# Patient Record
Sex: Male | Born: 1995 | Race: Black or African American | Hispanic: No | Marital: Single
Health system: Midwestern US, Community
[De-identification: ages and names within clinical notes are randomized; demographics above are authoritative.]

## PROBLEM LIST (undated history)

## (undated) DIAGNOSIS — T7840XA Allergy, unspecified, initial encounter: Secondary | ICD-10-CM

## (undated) HISTORY — DX: Allergy, unspecified, initial encounter: T78.40XA

---
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
## 2013-12-04 ENCOUNTER — Ambulatory Visit (INDEPENDENT_AMBULATORY_CARE_PROVIDER_SITE_OTHER): Payer: BC Managed Care – PPO | Admitting: Emergency Medicine

## 2013-12-04 VITALS — BP 126/72 | HR 80 | Temp 99.7°F | Resp 18 | Ht 75.0 in | Wt 186.0 lb

## 2013-12-04 DIAGNOSIS — J039 Acute tonsillitis, unspecified: Secondary | ICD-10-CM

## 2013-12-04 MED ORDER — AMOXICILLIN 500 MG PO CAPS: 500.0000 mg | ORAL_CAPSULE | Freq: Three times a day (TID) | ORAL | Status: DC

## 2013-12-04 NOTE — Patient Instructions (Signed)

## 2013-12-04 NOTE — Progress Notes (Signed)
[facility] [address], [place] [address] [phone]  Date:  12/04/2013   Name:  Victor BraverLaquan Jordan   DOB:  21-Dec-1995   MRN:  [id number]  PCP:  No PCP Per Patient    Chief Complaint: Sore Throat   History of Present Illness:  Victor Jordan is a 18 y.o. very pleasant male patient who presents with the following:  Has sore throat and fever.  Has difficulty swallowing.  No coryza or cough.  No wheezing or shortness of breath No nausea or vomiting.  No stool change.  No rash No improvement with over the counter medications or other home remedies.  Ill for past three days/ Denies other complaint or health concern today.   There are no active problems to display for this patient.   Past Medical History  Diagnosis Date  . Allergy     History reviewed. No pertinent past surgical history.  History  Substance Use Topics  . Smoking status: Never Smoker   . Smokeless tobacco: Never Used  . Alcohol Use: No    Family History  Problem Relation Age of Onset  . Diabetes Father     No Known Allergies  Medication list has been reviewed and updated.  Current Outpatient Prescriptions on File Prior to Visit  Medication Sig Dispense Refill  . desloratadine (CLARINEX) 5 MG tablet Take 5 mg by mouth daily.    . ondansetron (ZOFRAN ODT) 8 MG disintegrating tablet Take 1 tablet (8 mg total) by mouth every 8 (eight) hours as needed for nausea or vomiting. 10 tablet 0   No current facility-administered medications on file prior to visit.    Review of Systems:  As per HPI, otherwise negative.    Physical Examination: Filed Vitals:   12/04/13 1308  BP: 126/72  Pulse: 80  Temp: 99.7 F (37.6 C)  Resp: 18   Filed Vitals:   12/04/13 1308  Height: 6\' 3"  (1.905 m)  Weight: 186 lb (84.369 kg)   Body mass index is 23.25 kg/(m^2). Ideal Body Weight: Weight in (lb) to have BMI = 25: 199.6  GEN: WDWN, NAD, Non-toxic, A & O x 3 HEENT: Atraumatic,  Normocephalic. Neck supple. No masses, No LAD.  Exudative tonsillitis Ears and Nose: No external deformity. CV: RRR, No M/G/R. No JVD. No thrill. No extra heart sounds. PULM: CTA B, no wheezes, crackles, rhonchi. No retractions. No resp. distress. No accessory muscle use. ABD: S, NT, ND, +BS. No rebound. No HSM. EXTR: No c/c/e NEURO Normal gait.  PSYCH: Normally interactive. Conversant. Not depressed or anxious appearing.  Calm demeanor.    Assessment and Plan: Tonsillitis Amoxicillin  Signed,  Phillips OdorJeffery Lilyahna Sirmon, MD

## 2014-11-05 ENCOUNTER — Emergency Department (HOSPITAL_COMMUNITY): Payer: Self-pay

## 2014-11-05 ENCOUNTER — Emergency Department (HOSPITAL_COMMUNITY)
Admission: EM | Admit: 2014-11-05 | Discharge: 2014-11-06 | Disposition: A | Discharge status: Home / Self Care | Payer: BLUE CROSS/BLUE SHIELD | Attending: Emergency Medicine | Admitting: Emergency Medicine

## 2014-11-05 ENCOUNTER — Encounter (HOSPITAL_COMMUNITY): Payer: Self-pay | Admitting: *Deleted

## 2014-11-05 DIAGNOSIS — S0181XA Laceration without foreign body of other part of head, initial encounter: Secondary | ICD-10-CM | POA: Insufficient documentation

## 2014-11-05 DIAGNOSIS — Y9289 Other specified places as the place of occurrence of the external cause: Secondary | ICD-10-CM | POA: Insufficient documentation

## 2014-11-05 DIAGNOSIS — S0990XA Unspecified injury of head, initial encounter: Secondary | ICD-10-CM

## 2014-11-05 DIAGNOSIS — Z720 Tobacco use: Secondary | ICD-10-CM | POA: Insufficient documentation

## 2014-11-05 DIAGNOSIS — Y9383 Activity, rough housing and horseplay: Secondary | ICD-10-CM | POA: Insufficient documentation

## 2014-11-05 DIAGNOSIS — W01198A Fall on same level from slipping, tripping and stumbling with subsequent striking against other object, initial encounter: Secondary | ICD-10-CM | POA: Insufficient documentation

## 2014-11-05 DIAGNOSIS — Y998 Other external cause status: Secondary | ICD-10-CM | POA: Insufficient documentation

## 2014-11-05 MED ORDER — LIDOCAINE-EPINEPHRINE 2 %-1:100000 IJ SOLN
20.0000 mL | Freq: Once | INTRAMUSCULAR | Status: DC
Start: 2014-11-05 — End: 2014-11-06
  Filled 2014-11-05: qty 1

## 2014-11-05 NOTE — ED Notes (Signed)
Pt being sutured

## 2014-11-05 NOTE — ED Notes (Cosign Needed)
Dr. Estell HarpinZammit requested me to perform suture repair.    LACERATION REPAIR Performed by: Fayrene HelperRAN,Vivaan Helseth Authorized byFayrene Helper: Mavric Cortright Consent: Verbal consent obtained. Risks and benefits: risks, benefits and alternatives were discussed Consent given by: patient Patient identity confirmed: provided demographic data Prepped and Draped in normal sterile fashion Wound explored  Laceration Location: L forehead  Laceration Length: 3cm  Small debris seen in wound, which was cleansed and removed  Anesthesia: local infiltration  Local anesthetic: lidocaine 2% w epinephrine  Anesthetic total: 3 ml  Irrigation method: syringe Amount of cleaning: standard  Skin closure: prolene 5.0  Number of sutures: 4  Technique: surgical debridement with sterile scissor, approximation and undermine of tissue, simple interrupted  Patient tolerance: Patient tolerated the procedure well with no immediate complications.  LACERATION REPAIR Performed by: Fayrene HelperRAN,Josaiah Muhammed Authorized byFayrene Helper: Talulah Schirmer Consent: Verbal consent obtained. Risks and benefits: risks, benefits and alternatives were discussed Consent given by: patient Patient identity confirmed: provided demographic data Prepped and Draped in normal sterile fashion Wound explored  Laceration Location: L medial temple region, adjacent to lateral canthus  Laceration Length: 2 cm  No Foreign Bodies seen or palpated  Anesthesia: local infiltration  Local anesthetic: lidocaine 2% w epinephrine  Anesthetic total: 1 ml  Irrigation method: syringe Amount of cleaning: standard  Skin closure: prolene 6.0  Number of sutures: 2  Technique: simple interrupted  Patient tolerance: Patient tolerated the procedure well with no immediate complications.    Fayrene HelperBowie Susy Placzek, PA-C 11/06/14 0002

## 2014-11-05 NOTE — ED Notes (Signed)
Pt states that he was horseplaying and fell out of a moving truck bed; pt states that he struck the left side if face in the cement; pt denies LOC; pt denies neck or back pain; pt with several lacerations and abrasions to forehead , cheek and ear to left side of face; pt also with abrasions to left shoulder, left elbow and left hand; pt ambulatory to triage without difficulty

## 2014-11-05 NOTE — ED Notes (Signed)
Returned from CT.

## 2014-11-06 MED ORDER — CEPHALEXIN 500 MG PO CAPS: 500.0000 mg | ORAL_CAPSULE | Freq: Three times a day (TID) | ORAL | Status: DC

## 2014-11-06 MED ORDER — IBUPROFEN 800 MG PO TABS: 800.0000 mg | ORAL_TABLET | Freq: Three times a day (TID) | ORAL | Status: DC | PRN

## 2014-11-06 NOTE — Discharge Instructions (Signed)
Follow up with Dr. Pollyann Kennedyosen in 2-3 days,  Or see one of his partners.  Sutures out in one week

## 2014-11-09 ENCOUNTER — Encounter (HOSPITAL_COMMUNITY): Payer: Self-pay | Admitting: Emergency Medicine

## 2014-11-09 ENCOUNTER — Emergency Department (HOSPITAL_COMMUNITY)
Admission: EM | Admit: 2014-11-09 | Discharge: 2014-11-09 | Disposition: A | Discharge status: Home / Self Care | Payer: BLUE CROSS/BLUE SHIELD | Attending: Emergency Medicine | Admitting: Emergency Medicine

## 2014-11-09 DIAGNOSIS — F17210 Nicotine dependence, cigarettes, uncomplicated: Secondary | ICD-10-CM | POA: Diagnosis not present

## 2014-11-09 DIAGNOSIS — Z4802 Encounter for removal of sutures: Secondary | ICD-10-CM | POA: Diagnosis not present

## 2014-11-09 DIAGNOSIS — Z4801 Encounter for change or removal of surgical wound dressing: Secondary | ICD-10-CM | POA: Diagnosis present

## 2014-11-09 DIAGNOSIS — Z5189 Encounter for other specified aftercare: Secondary | ICD-10-CM

## 2014-11-09 NOTE — ED Provider Notes (Signed)
CSN: [id number]     Arrival date & time 11/09/14  1133 History  By signing my name below, I, Elon SpannerGarrett Cook, attest that this documentation has been prepared under the direction and in the presence of [facility] HospitalKaitlyn Juanmiguel Defelice, PA-C. Electronically Signed: Elon SpannerGarrett Cook ED Scribe. 11/09/2014. 11:53 AM.    Chief Complaint  Patient presents with  . Wound Check   The history is provided by the patient. No language interpreter was used.   HPI Comments: Garald BraverLaquan Provencher is a 19 y.o. male who presents to the Emergency Department for wound check/removal of sutures on the left forehead and temple placed on 10/24 in ED.  He also notes some abrasions to the left forearm.  The initial injuries were sustained after the patient fell out of a truck bed while doing donuts.  He received a normal workup in ED including CT scan of head, face, and neck.  He has been compliant with antibiotics and used ibuprofen for pain.  He denies other complaints.   Past Medical History  Diagnosis Date  . Allergy    No past surgical history on file. Family History  Problem Relation Age of Onset  . Diabetes Father    Social History  Substance Use Topics  . Smoking status: Current Some Day Smoker  . Smokeless tobacco: Never Used  . Alcohol Use: Yes     Comment: socially    Review of Systems  Skin: Positive for wound.  All other systems reviewed and are negative.     Allergies  Review of patient's allergies indicates no known allergies.  Home Medications   Prior to Admission medications   Medication Sig Start Date End Date Taking? Authorizing Provider  cephALEXin (KEFLEX) 500 MG capsule Take 1 capsule (500 mg total) by mouth 3 (three) times daily. 11/06/14   Bethann BerkshireJoseph Zammit, MD  desloratadine (CLARINEX) 5 MG tablet Take 5 mg by mouth daily as needed (allergies).     Historical Provider, MD  ibuprofen (ADVIL,MOTRIN) 800 MG tablet Take 1 tablet (800 mg total) by mouth every 8 (eight) hours as needed for moderate pain.  11/06/14   Bethann BerkshireJoseph Zammit, MD   There were no vitals taken for this visit. Physical Exam  Constitutional: He is oriented to person, place, and time. He appears well-developed and well-nourished. No distress.  HENT:  Head: Normocephalic and atraumatic.  Eyes: Conjunctivae and EOM are normal.  Neck: Neck supple. No tracheal deviation present.  Cardiovascular: Normal rate.   Pulmonary/Chest: Effort normal. No respiratory distress.  Abdominal: Soft. He exhibits no distension. There is no tenderness.  Musculoskeletal: Normal range of motion.  Neurological: He is alert and oriented to person, place, and time. Coordination normal.  Skin: Skin is warm and dry.  Right facial abrasions with 6 sutures intact of right forehead. No open wounds or drainage noted.   Abrasions noted to right forearm.   Psychiatric: He has a normal mood and affect. His behavior is normal.  Nursing note and vitals reviewed.   ED Course  Procedures (including critical care time)  SUTURE REMOVAL Performed by: Emilia BeckKaitlyn Heidi Maclin  Consent: Verbal consent obtained. Patient identity confirmed: provided demographic data Time out: Immediately prior to procedure a "time out" was called to verify the correct patient, procedure, equipment, support staff and site/side marked as required.  Location details: right forehead  Wound Appearance: clean  Sutures/Staples Removed: 6  Facility: sutures placed in this facility Patient tolerance: Patient tolerated the procedure well with no immediate complications.   DIAGNOSTIC STUDIES: Oxygen  Saturation is 99% on RA, normal by my interpretation.    COORDINATION OF CARE:  11:52 AM Will remove sutures.  Patient acknowledges and agrees with plan.    Labs Review Labs Reviewed - No data to display  Imaging Review No results found. I have personally reviewed and evaluated these images and lab results as part of my medical decision-making.   EKG Interpretation None       MDM   Final diagnoses:  Visit for suture removal  Visit for wound check    Patient's sutures removed without difficulty. Patient's abrasions healing. Patient advised to continue to keep wounds clean and covered and apply antibiotic ointment.  I personally performed the services described in this documentation, which was scribed in my presence. The recorded information has been reviewed and is accurate.    Emilia Beck, PA-C 11/09/14 1457  Gilda Crease, MD 11/09/14 1531

## 2014-11-09 NOTE — ED Notes (Signed)
Here for recheck of left facial and left arm wounds. Sutures to left brow area.

## 2014-11-12 NOTE — ED Provider Notes (Signed)
CSN: [id number]     Arrival date & time 11/05/14  2051 History   First MD Initiated Contact with Patient 11/05/14 2118     Chief Complaint  Patient presents with  . Facial Injury     (Consider location/radiation/quality/duration/timing/severity/associated sxs/prior Treatment) Patient is a 19 y.o. male presenting with facial injury. The history is provided by the patient (Patient fell out of a pickup truck and hit his head on the street. No loss of consciousness).  Facial Injury Mechanism of injury:  Direct blow Location:  Face and forehead Pain details:    Quality:  Aching   Severity:  Mild   Timing:  Constant Chronicity:  New Foreign body present:  No foreign bodies Worsened by:  Nothing tried Associated symptoms: headaches   Associated symptoms: no congestion     Past Medical History  Diagnosis Date  . Allergy    History reviewed. No pertinent past surgical history. Family History  Problem Relation Age of Onset  . Diabetes Father    Social History  Substance Use Topics  . Smoking status: Current Some Day Smoker  . Smokeless tobacco: Never Used  . Alcohol Use: Yes     Comment: socially    Review of Systems  Constitutional: Negative for appetite change and fatigue.  HENT: Negative for congestion, ear discharge and sinus pressure.   Eyes: Negative for discharge.  Respiratory: Negative for cough.   Cardiovascular: Negative for chest pain.  Gastrointestinal: Negative for abdominal pain and diarrhea.  Genitourinary: Negative for frequency and hematuria.  Musculoskeletal: Negative for back pain.  Skin: Negative for rash.  Neurological: Positive for headaches. Negative for seizures.  Psychiatric/Behavioral: Negative for hallucinations.      Allergies  Review of patient's allergies indicates no known allergies.  Home Medications   Prior to Admission medications   Medication Sig Start Date End Date Taking? Authorizing Provider  desloratadine (CLARINEX) 5 MG  tablet Take 5 mg by mouth daily as needed (allergies).    Yes Historical Provider, MD  cephALEXin (KEFLEX) 500 MG capsule Take 1 capsule (500 mg total) by mouth 3 (three) times daily. 11/06/14   Bethann BerkshireJoseph Titus Drone, MD  ibuprofen (ADVIL,MOTRIN) 800 MG tablet Take 1 tablet (800 mg total) by mouth every 8 (eight) hours as needed for moderate pain. 11/06/14   Bethann BerkshireJoseph Paeton Studer, MD   BP 154/98 mmHg  Pulse 84  Temp(Src) 98.1 F (36.7 C) (Oral)  Resp 20  Ht 6\' 5"  (1.956 m)  Wt 180 lb (81.647 kg)  BMI 21.34 kg/m2  SpO2 99% Physical Exam  Constitutional: He is oriented to person, place, and time. He appears well-developed.  HENT:  Head: Normocephalic.  Patient is a 3 cm superficial laceration to left forehead. And a 2 cm laceration to the left temporal area. He has abrasions to his left ear  Eyes: Conjunctivae and EOM are normal. No scleral icterus.  Neck: Neck supple. No thyromegaly present.  Cardiovascular: Normal rate and regular rhythm.  Exam reveals no gallop and no friction rub.   No murmur heard. Pulmonary/Chest: No stridor. He has no wheezes. He has no rales. He exhibits no tenderness.  Abdominal: He exhibits no distension. There is no tenderness. There is no rebound.  Musculoskeletal: Normal range of motion. He exhibits no edema.  Lymphadenopathy:    He has no cervical adenopathy.  Neurological: He is oriented to person, place, and time. He exhibits normal muscle tone. Coordination normal.  Skin: No rash noted. No erythema.  Psychiatric: He has a normal  mood and affect. His behavior is normal.    ED Course  Procedures (including critical care time) Labs Review Labs Reviewed - No data to display  Imaging Review No results found. I have personally reviewed and evaluated these images and lab results as part of my medical decision-making.   EKG Interpretation None      MDM   Final diagnoses:  Head injury, initial encounter    Multiple lacerations to the face and head. CT scans  negative. Lacerations closed by PA Bowie.  Patient tolerated procedure well.    Bethann Berkshire, MD 11/12/14 [phone]

## 2016-04-22 IMAGING — CT CT HEAD W/O CM
3 of 9 series · 12 of 47 positions shown, 14 images · non-contrast
Comparison: None.

CLINICAL DATA: Fell out of moving truck bed, and struck left side
of face on cement. Concern for head or cervical spine injury.
Several lacerations and abrasions at the forehead, cheek and left
ear. Initial encounter.

EXAM:
CT HEAD WITHOUT CONTRAST
CT MAXILLOFACIAL WITHOUT CONTRAST
CT CERVICAL SPINE WITHOUT CONTRAST
TECHNIQUE: Multidetector CT imaging of the head, cervical spine, and
maxillofacial structures were performed using the standard protocol
without intravenous contrast. Multiplanar CT image reconstructions
of the cervical spine and maxillofacial structures were also
generated.

[Series 12: coronal · coronal · 0.26mm/px · 3 of 72 slices shown]
[im 18/72  brain]
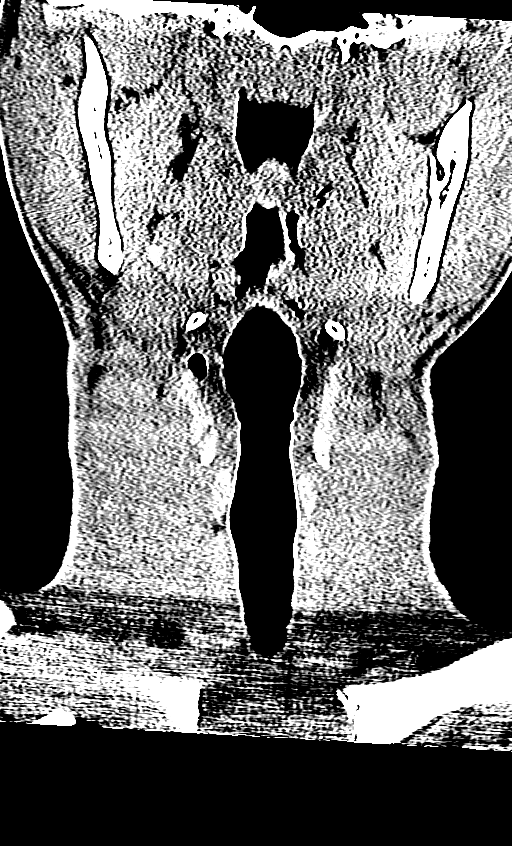
[im 36/72  brain]
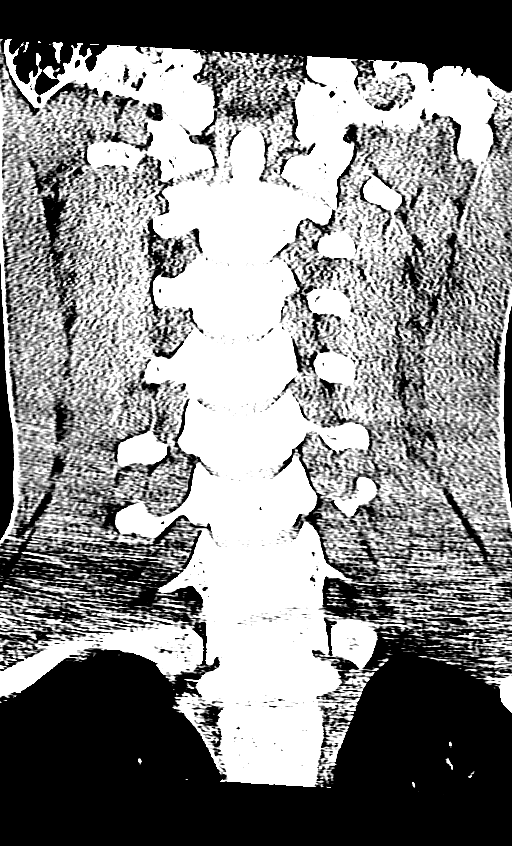
[im 54/72  brain]
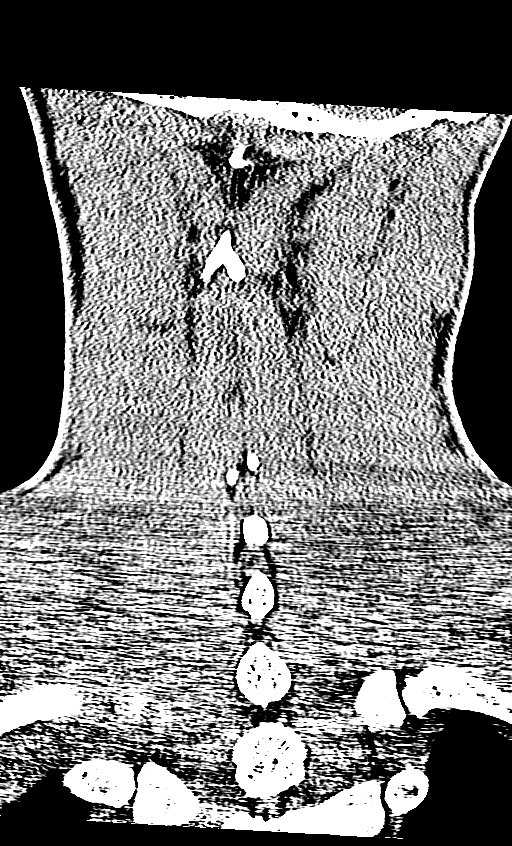

[Series 14: axial · axial · 0.23mm/px · z∈[-333,-175]mm · 7 of 112 slices shown, 9 images]
[im 14/112  brain]
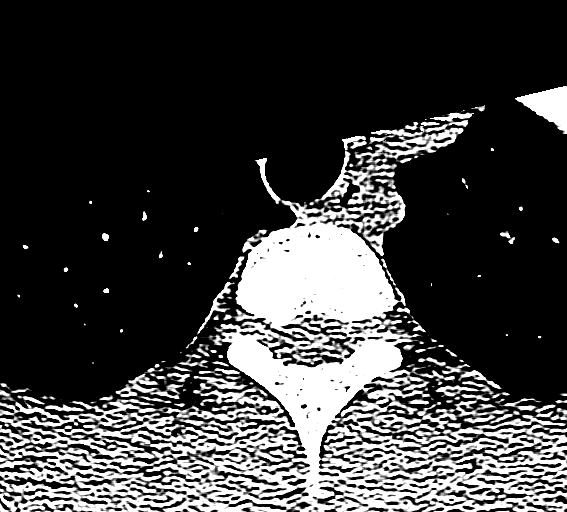
[im 14/112  bone]
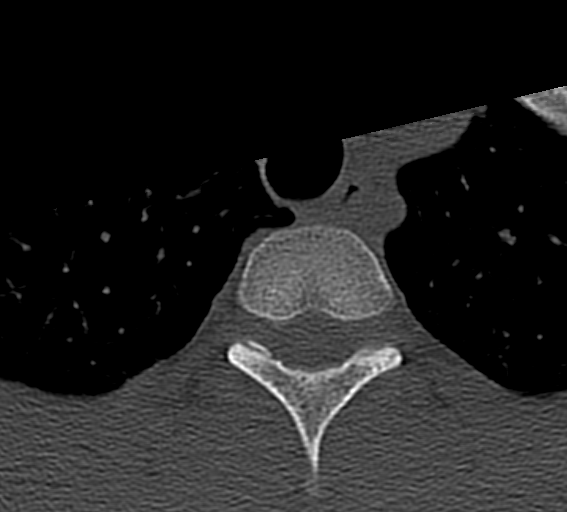
[im 28/112  brain]
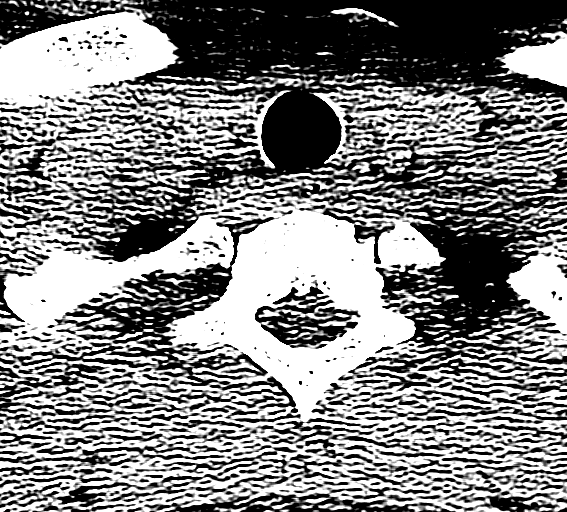
[im 42/112  brain]
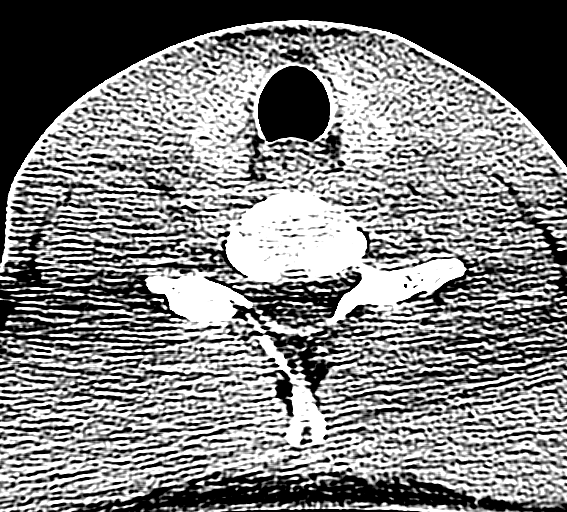
[im 56/112  brain]
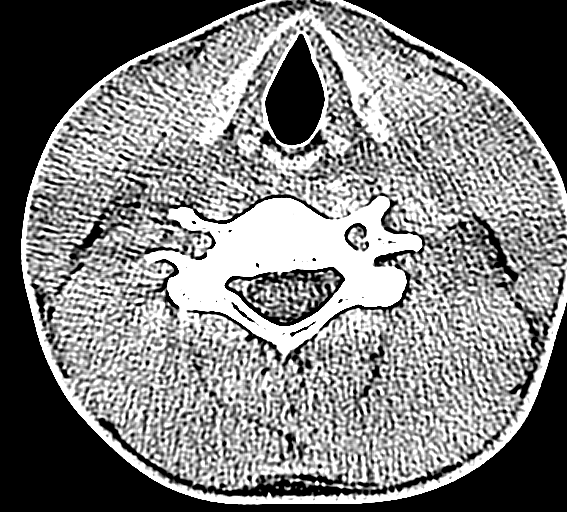
[im 70/112  brain]
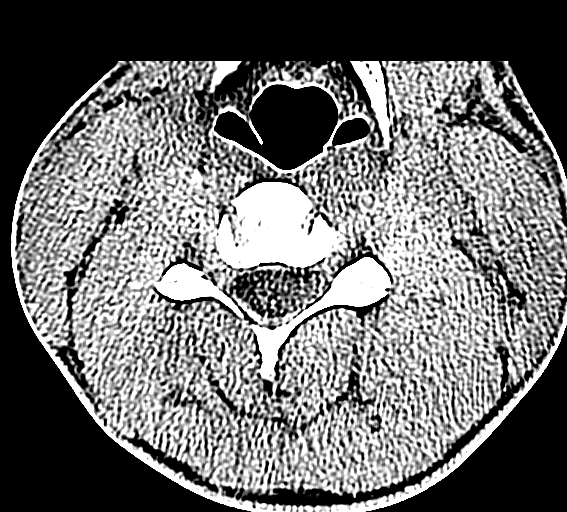
[im 70/112  bone]
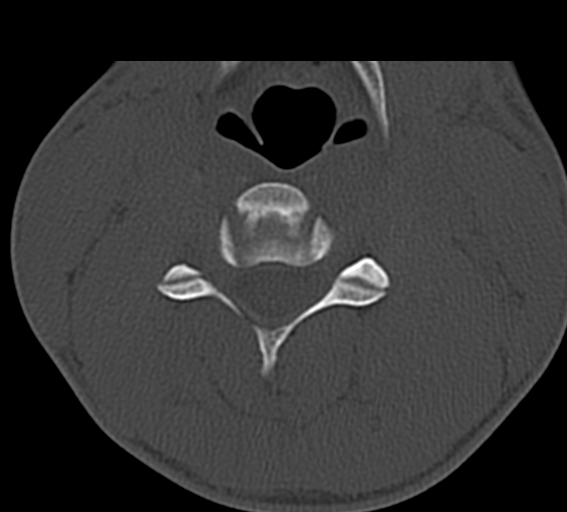
[im 84/112  brain]
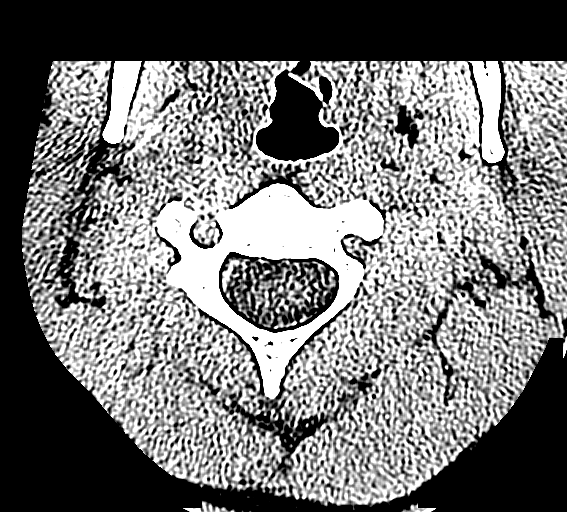
[im 98/112  brain]
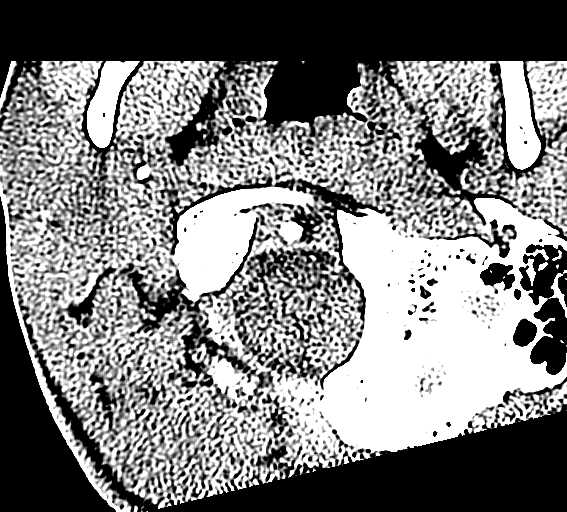

[Series 16: sagittal st · sagittal · 0.36mm/px · 2 of 83 slices shown]
[im 28/83  brain]
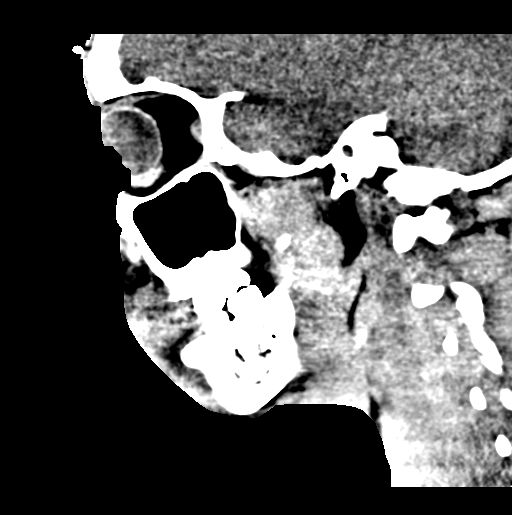
[im 55/83  brain]
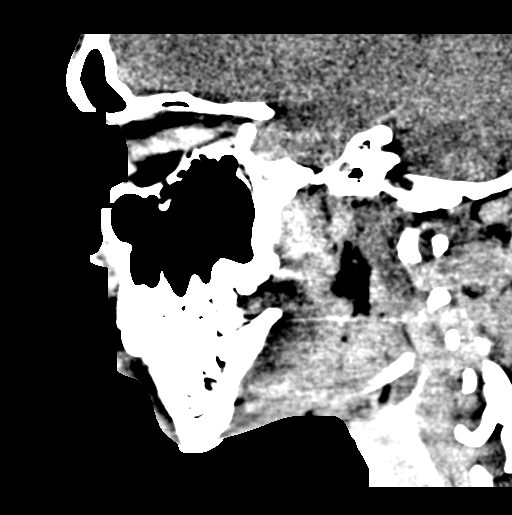

[12 of 47 positions shown; findings below may reference images not displayed]

FINDINGS: CT HEAD FINDINGS

There is no evidence of acute infarction, mass lesion, or intra- or
extra-axial hemorrhage on CT.

The posterior fossa, including the cerebellum, brainstem and fourth
ventricle, is within normal limits. The third and lateral
ventricles, and basal ganglia are unremarkable in appearance. The
cerebral hemispheres are symmetric in appearance, with normal
gray-white differentiation. No mass effect or midline shift is seen.

There is no evidence of fracture; visualized osseous structures are
unremarkable in appearance. The visualized portions of the orbits
are within normal limits. The paranasal sinuses and mastoid air
cells are well-aerated. Soft tissue swelling is noted along the left
side of the head, with a soft tissue laceration overlying the left
orbit, and soft tissue swelling extending overlying the left
zygomatic arch.

CT MAXILLOFACIAL FINDINGS

There is no evidence of fracture or dislocation. The maxilla and
mandible appear intact. The nasal bone is unremarkable in
appearance. The visualized dentition demonstrates no acute
abnormality.

The orbits are intact bilaterally. The visualized paranasal sinuses
and mastoid air cells are well-aerated.

Soft tissue swelling is noted lateral to the left orbit, with
associated soft tissue lacerations lateral and superior to the left
orbit. The parapharyngeal fat planes are preserved. The nasopharynx,
oropharynx and hypopharynx are unremarkable in appearance. The
visualized portions of the valleculae and piriform sinuses are
grossly unremarkable.

The parotid and submandibular glands are within normal limits. No
cervical lymphadenopathy is seen.

CT CERVICAL SPINE FINDINGS

There is no evidence of fracture or subluxation. Vertebral bodies
demonstrate normal height and alignment. Intervertebral disc spaces
are preserved. Prevertebral soft tissues are within normal limits.
The visualized neural foramina are grossly unremarkable.

The thyroid gland is unremarkable in appearance. The visualized lung
apices are clear. No significant soft tissue abnormalities are seen.
IMPRESSION: 1. No evidence of traumatic intracranial injury or fracture.
2. No evidence of fracture or dislocation with regard to the
maxillofacial structures.
3. No evidence of fracture or subluxation along the cervical spine.
4. Soft tissue swelling along the left side of the head and lateral
to the left orbit, extending overlying the left zygomatic arch. Soft
tissue lacerations lateral and superior to the left orbit.

## 2017-04-02 ENCOUNTER — Other Ambulatory Visit: Payer: Self-pay

## 2017-04-02 ENCOUNTER — Emergency Department (HOSPITAL_COMMUNITY)
Admission: EM | Admit: 2017-04-02 | Discharge: 2017-04-02 | Disposition: A | Discharge status: Home / Self Care | Payer: BLUE CROSS/BLUE SHIELD | Attending: Emergency Medicine | Admitting: Emergency Medicine

## 2017-04-02 ENCOUNTER — Encounter (HOSPITAL_COMMUNITY): Payer: Self-pay

## 2017-04-02 DIAGNOSIS — Z4802 Encounter for removal of sutures: Secondary | ICD-10-CM | POA: Insufficient documentation

## 2017-04-02 NOTE — ED Provider Notes (Signed)
[facility]-EMERGENCY DEPT Provider Note   CSN: [id number] Arrival date & time: 04/02/17  4098     History   Chief Complaint Chief Complaint  Patient presents with  . Suture / Staple Removal    HPI Victor Jordan is a 22 y.o. male.  HPI  Patient is a 22 year old male with a history of allergies presenting for suture removal.  Patient reports that 1 week ago he fell on his chin and had the sutures placed at [facility] in [place].  Patient denies any erythema or purulent drainage from the wound over this interval.  Patient reports he has been washing with soap and water.  Past Medical History:  Diagnosis Date  . Allergy     There are no active problems to display for this patient.   History reviewed. No pertinent surgical history.     Home Medications    Prior to Admission medications   Medication Sig Start Date End Date Taking? Authorizing Provider  desloratadine (CLARINEX) 5 MG tablet Take 5 mg by mouth daily as needed (allergies).    Yes [provider]    Family History Family History  Problem Relation Age of Onset  . Diabetes Father     Social History Social History   Tobacco Use  . Smoking status: Former Games developer  . Smokeless tobacco: Never Used  Substance Use Topics  . Alcohol use: Yes    Comment: socially  . Drug use: No     Allergies   Patient has no known allergies.   Review of Systems Review of Systems  Musculoskeletal: Negative for arthralgias.  Skin: Positive for wound. Negative for color change.     Physical Exam Updated Vital Signs BP 140/71 (BP Location: Right Arm)   Pulse (!) 52   Temp 98 F (36.7 C) (Oral)   Resp 16   Ht 6\' 5"  (1.956 m)   Wt 81.6 kg (180 lb)   SpO2 100%   BMI 21.34 kg/m   Physical Exam  Constitutional: He appears well-developed and well-nourished. No distress.  Sitting comfortably in bed.  HENT:  Head: Normocephalic and atraumatic.    Eyes: Conjunctivae are normal.  Right eye exhibits no discharge. Left eye exhibits no discharge.  EOMs normal to gross examination.  Neck: Normal range of motion.  Pulmonary/Chest:  Normal respiratory effort. Patient converses comfortably. No audible wheeze or stridor.  Abdominal: He exhibits no distension.  Musculoskeletal: Normal range of motion.  Neurological: He is alert.  Cranial nerves intact to gross observation. Patient moves extremities without difficulty.  Skin: Skin is warm and dry. He is not diaphoretic.  Psychiatric: He has a normal mood and affect. His behavior is normal. Judgment and thought content normal.  Nursing note and vitals reviewed.    ED Treatments / Results  Labs (all labs ordered are listed, but only abnormal results are displayed) Labs Reviewed - No data to display  EKG  EKG Interpretation None       Radiology No results found.  Procedures Procedures (including critical care time)  Medications Ordered in ED Medications - No data to display   Initial Impression / Assessment and Plan / ED Course  I have reviewed the triage vital signs and the nursing notes.  Pertinent labs & imaging results that were available during my care of the patient were reviewed by me and considered in my medical decision making (see chart for details).     Patient is well-appearing and appears to have a  well-healed laceration closed upon multiple layers on the chin.  7 Prolene sutures were removed today.  Wound is well approximated.  I provided education on further cleaning and care of the wound.  Patient is in understanding and agrees the plan of care.  Final Clinical Impressions(s) / ED Diagnoses   Final diagnoses:  Visit for suture removal    ED Discharge Orders    None       Delia ChimesMurray, Alyssa B, PA-C 04/02/17 1128    Benjiman CorePickering, Nathan, MD 04/02/17 [phone]

## 2017-04-02 NOTE — ED Triage Notes (Signed)
Patient requesting suture removal from chin.

## 2017-04-02 NOTE — Discharge Instructions (Addendum)
Please continue to wash the area with soap and water.  Do not scrub.  You may remove the crusting around the edges of the wound with a 50% solution of water and hydrogen peroxide.  Return for any uncontrolled bleeding, reopening of the wound, redness or green or yellow drainage from the wound.  Thank you for allowing us to participate in your care today.

## 2017-04-02 NOTE — ED Notes (Signed)
Bed: WTR5 Expected date:  Expected time:  Means of arrival:  Comments: 

## 2019-06-29 ENCOUNTER — Inpatient Hospital Stay
Admit: 2019-06-29 | Discharge: 2019-06-29 | Disposition: A | Discharge status: Home / Self Care | Attending: Emergency Medicine

## 2019-06-29 DIAGNOSIS — L03319 Cellulitis of trunk, unspecified: Secondary | ICD-10-CM

## 2019-06-29 MED ORDER — SULFAMETHOXAZOLE-TRIMETHOPRIM 800-160 MG PO TABS
800-160 MG | ORAL_TABLET | Freq: Two times a day (BID) | ORAL | 0 refills | Status: AC
Start: 2019-06-29 — End: 2019-07-06

## 2019-06-29 NOTE — Discharge Instructions (Signed)
Please make an appointment to follow up with your primary doctor and/or the specialist as we discussed. Take all medications as prescribed.  Return to ER if condition worsens or you develop any new/concerning symptoms as we discussed.

## 2019-06-29 NOTE — ED Provider Notes (Signed)
[facility] ED  [address] [place] [address]  Phone: [phone]        [facility] ED  EMERGENCY DEPARTMENT ENCOUNTER      Pt Name: Max Day  MRN: [id number]  Birthdate 1995-11-01  Date of evaluation: 06/29/2019  Provider: Delbert Phenix, DO    CHIEF COMPLAINT       Chief Complaint   Patient presents with   ??? Mass     just at belt line, mid lower abdomen, started monday, painful to touch, does shave         HISTORY OF PRESENT ILLNESS   (Location/Symptom, Timing/Onset,Context/Setting, Quality, Duration, Modifying Factors, Severity)  Note limiting factors.   Max Day is a 24 y.o. male who presents to the emergency department for the evaluation of a mass on his belly.  Patient states is just below his bellybutton has been present for about 2 days.  He is concerned it might be a hernia.  No nausea, vomiting or diarrhea.  No history of cellulitis or abscess.  No recent injury.  No trauma.  The little bit irritating but otherwise no significant complaints associated with it.  No fever.    Nursing Notes were reviewed.    REVIEW OF SYSTEMS    (2-9systems for level 4, 10 or more for level 5)     Review of Systems   Constitutional: Negative for fever.   Skin: Positive for color change.   Neurological: Negative for weakness.       Except asnoted above the remainder of the review of systems was reviewed and negative.       PAST MEDICAL HISTORY   History reviewed. No pertinent past medical history.      SURGICAL HISTORY     History reviewed. No pertinent surgical history.      CURRENT MEDICATIONS     Previous Medications    No medications on file       ALLERGIES     Patient has no known allergies.    FAMILY HISTORY     History reviewed. No pertinent family history.       SOCIAL HISTORY       Social History     Socioeconomic History   ??? Marital status: None     Spouse name: None   ??? Number of children: None   ??? Years of education: None   ??? Highest education level: None   Occupational History   ???  None   Tobacco Use   ??? Smoking status: Never Smoker   ??? Smokeless tobacco: Never Used   Vaping Use   ??? Vaping Use: Never used   Substance and Sexual Activity   ??? Alcohol use: Yes     Comment: 3x a week   ??? Drug use: Never   ??? Sexual activity: None   Other Topics Concern   ??? None   Social History Narrative   ??? None     Social Determinants of Health     Financial Resource Strain:    ??? Difficulty of Paying Living Expenses:    Food Insecurity:    ??? Worried About Programme researcher, broadcasting/film/video in the Last Year:    ??? Barista in the Last Year:    Transportation Needs:    ??? Freight forwarder (Medical):    ??? Lack of Transportation (Non-Medical):    Physical Activity:    ??? Days of Exercise per Week:    ???  Minutes of Exercise per Session:    Stress:    ??? Feeling of Stress :    Social Connections:    ??? Frequency of Communication with Friends and Family:    ??? Frequency of Social Gatherings with Friends and Family:    ??? Attends Religious Services:    ??? Database administrator or Organizations:    ??? Attends Engineer, structural:    ??? Marital Status:    Intimate Programme researcher, broadcasting/film/video Violence:    ??? Fear of Current or Ex-Partner:    ??? Emotionally Abused:    ??? Physically Abused:    ??? Sexually Abused:        SCREENINGS             PHYSICAL EXAM    (up to 7 for level 4, 8 or more for level 5)     ED Triage Vitals [06/29/19 1210]   BP Temp Temp Source Pulse Resp SpO2 Height Weight   (!) 153/86 98.1 ??F (36.7 ??C) Oral 87 18 97 % 6\' 3"  (1.905 m) 185 lb (83.9 kg)       Physical Exam  Vitals and nursing note reviewed.   Constitutional:       General: He is not in acute distress.     Appearance: Normal appearance. He is not ill-appearing or toxic-appearing.   HENT:      Head: Normocephalic and atraumatic.      Nose: Nose normal. No congestion.      Mouth/Throat:      Mouth: Mucous membranes are moist.   Eyes:      General:         Right eye: No discharge.         Left eye: No discharge.      Conjunctiva/sclera: Conjunctivae normal.    Cardiovascular:      Rate and Rhythm: Normal rate and regular rhythm.      Pulses: Normal pulses.      Heart sounds: Normal heart sounds. No murmur heard.     Pulmonary:      Effort: Pulmonary effort is normal. No respiratory distress.      Breath sounds: Normal breath sounds. No wheezing.   Abdominal:      General: Abdomen is flat. There is no distension.      Palpations: Abdomen is soft.      Tenderness: There is no abdominal tenderness. There is no guarding or rebound.      Hernia: No hernia is present.   Musculoskeletal:         General: No deformity or signs of injury. Normal range of motion.      Cervical back: Normal range of motion.   Skin:     General: Skin is warm and dry.      Capillary Refill: Capillary refill takes less than 2 seconds.      Findings: No rash.      Comments: Patient has a small superficial lesion just below the umbilicus, it is 2 cm wide by 1 cm tall, it is indurated, it is not fluctuant.   Neurological:      General: No focal deficit present.      Mental Status: He is alert. Mental status is at baseline.      Motor: No weakness.      Comments: Speaking normally. No facial asymmetry. Moving all 4 extremities. Normal gait.    Psychiatric:         Mood and Affect: Mood normal.  EMERGENCY DEPARTMENT COURSE and DIFFERENTIAL DIAGNOSIS/MDM:   Vitals:    Vitals:    06/29/19 1210   BP: (!) 153/86   Pulse: 87   Resp: 18   Temp: 98.1 ??F (36.7 ??C)   TempSrc: Oral   SpO2: 97%   Weight: 83.9 kg (185 lb)   Height: 6\' 3"  (1.905 m)       Patient presents to the emergency department with a complaint described above.  Vital signs show slight hypertension, otherwise unremarkable.  Physical exam reveals evidence of cellulitis on his abdomen.  I did explain that this could turn into abscess, however at this time there is no head, no fluctuance and nothing to suggest an I&D would be beneficial.  I think he should do antibiotics and warm compresses and follow-up with PCP or return to ER if needed at  this time the patient is without objective evidence of an acute process requiring hospitalization or inpatient management. They have remained hemodynamically stable and are stable for discharge with outpatient follow-up.     Standard anticipatory guidance given to patient upon discharge.  Have given them a specific time frame in which to follow-up and who to follow-up with.  I have also advised them that they should return to the emergency department if they get worse, or not getting better or develop any new or concerning symptoms.  Patient demonstrates understanding.               PROCEDURES:  Unless otherwise noted below, none     Procedures    FINAL IMPRESSION      1. Cellulitis and abscess of trunk          DISPOSITION/PLAN   DISPOSITION Decision To Discharge 06/29/2019 12:31:14 PM      PATIENT REFERRED TO:  Your primary doctor  If you do not have a primary care physician or you are looking for a new physician, please contact the following number [phone] to establish one.  In 1 week        DISCHARGE MEDICATIONS:  New Prescriptions    SULFAMETHOXAZOLE-TRIMETHOPRIM (BACTRIM DS) 800-160 MG PER TABLET    Take 1 tablet by mouth 2 times daily for 7 days          (Please note that portions of this note were completed with a voice recognition program.  Efforts were made to edit the dictations but occasionally words are mis-transcribed.)    Kirstie Peri, DO (electronically signed)  Board Certified Emergency Physician          Kirstie Peri, DO  06/29/19 1236

## 2021-02-18 DIAGNOSIS — S61210A Laceration without foreign body of right index finger without damage to nail, initial encounter: Secondary | ICD-10-CM

## 2021-02-18 NOTE — ED Provider Notes (Addendum)
[facility] ED     Emergency Department     Faculty Attestation        I performed a history and physical examination of the patient and discussed management with the resident. I reviewed the resident???s note and agree with the documented findings and plan of care. Any areas of disagreement are noted on the chart. I was personally present for the key portions of any procedures. I have documented in the chart those procedures where I was not present during the key portions. I have reviewed the emergency nurses triage note. I agree with the chief complaint, past medical history, past surgical history, allergies, medications, social and family history as documented unless otherwise noted below.  For Physician Assistant/ Nurse Practitioner cases/documentation I have personally evaluated this patient and have completed at least one if not all key elements of the E/M (history, physical exam, and MDM). Additional findings are as noted.    PCP:  No primary care provider on file.    Pertinent Comments:     26 yom with small laceration on index finger distal dorsal aspect.    Neurovascularly intact distally.    Well approximated without any intervention.    Will dermabond after copious cleansing.      Critical Care  None      (Please note that portions of this note were completed with a voice recognition program. Efforts were made to edit the dictations but occasionally words are mis-transcribed. Whenever words are used in this note in any gender, they shall be construed as though they were used in the gender appropriate to the circumstances; and whenever words are used in this note in the singular or plural form, they shall be construed as though they were used in the form appropriate to the circumstances.)    Chevis Pretty, MD [facility]  Attending Emergency Medicine Physician           Salomon Mast, MD  02/18/21 1934       Salomon Mast, MD  02/19/21 1247

## 2021-02-18 NOTE — ED Notes (Signed)
Pt to ED via triage with [employer] for a cut on his finger that happened today at work. Pt states he cut it on a saw today at work. Pt arrived to ED with bleeding controlled. Pt is on stretcher, vitals steady, and no needs or other complaints at this time.      Fransico Him, RN  02/18/21 [phone]

## 2021-02-18 NOTE — ED Provider Notes (Signed)
[facility] ED  Emergency Department Encounter  Emergency Medicine Resident     Pt Name:Max Day [place]  MRN: [id number]  Birthdate Jul 24, 1995  Date of evaluation: 02/18/21  PCP:  No primary care provider on file.  Note Started: 7:31 PM EST      CHIEF COMPLAINT       Chief Complaint   Patient presents with    Laceration       HISTORY OF PRESENT ILLNESS  (Location/Symptom, Timing/Onset, Context/Setting, Quality, Duration, Modifying Factors, Severity.)      Max Day is a 26 y.o. male who presents with laceration on the right hand.  Patient cut this while at work, it is on the dorsal pointer finger.  No active bleeding.  Tetanus is up-to-date by report.  No other injuries from this incident.  Patient denies any other symptoms or complaints at this time.    PAST MEDICAL / SURGICAL / SOCIAL / FAMILY HISTORY      has no past medical history on file.       has no past surgical history on file.      Social History     Socioeconomic History    Marital status: Single     Spouse name: Not on file    Number of children: Not on file    Years of education: Not on file    Highest education level: Not on file   Occupational History    Not on file   Tobacco Use    Smoking status: Never    Smokeless tobacco: Never   Vaping Use    Vaping Use: Never used   Substance and Sexual Activity    Alcohol use: Yes     Comment: 3x a week    Drug use: Never    Sexual activity: Not on file   Other Topics Concern    Not on file   Social History Narrative    Not on file     Social Determinants of Health     Financial Resource Strain: Not on file   Food Insecurity: Not on file   Transportation Needs: Not on file   Physical Activity: Not on file   Stress: Not on file   Social Connections: Not on file   Intimate Partner Violence: Not on file   Housing Stability: Not on file       History reviewed. No pertinent family history.    Allergies:  Patient has no known allergies.    Home Medications:  Prior to Admission medications    Not on  File         REVIEW OF SYSTEMS       Review of Systems   Constitutional:  Negative for chills and fever.   HENT:  Negative for congestion and rhinorrhea.    Eyes:  Negative for visual disturbance.   Respiratory:  Negative for shortness of breath.    Cardiovascular:  Negative for chest pain.   Gastrointestinal:  Negative for abdominal pain, constipation, diarrhea, nausea and vomiting.   Musculoskeletal:  Negative for back pain and neck pain.   Skin:  Positive for wound. Negative for rash.   Neurological:  Negative for weakness, numbness and headaches.     PHYSICAL EXAM      INITIAL VITALS:   BP 138/77    Pulse 65    Temp 97.3 ??F (36.3 ??C) (Oral)    Resp 16    Ht 6\' 1"  (1.854 m)    Wt 170  lb (77.1 kg)    SpO2 97%    BMI 22.43 kg/m??     Physical Exam  Constitutional:       General: He is not in acute distress.     Appearance: Normal appearance. He is not ill-appearing, toxic-appearing or diaphoretic.   HENT:      Head: Normocephalic and atraumatic.      Mouth/Throat:      Mouth: Mucous membranes are moist.      Pharynx: Oropharynx is clear.   Eyes:      Extraocular Movements: Extraocular movements intact.   Cardiovascular:      Rate and Rhythm: Normal rate and regular rhythm.      Heart sounds: Normal heart sounds. No murmur heard.  Pulmonary:      Effort: Pulmonary effort is normal.      Breath sounds: Normal breath sounds.   Abdominal:      Palpations: Abdomen is soft.      Tenderness: There is no abdominal tenderness.   Musculoskeletal:         General: Normal range of motion.      Cervical back: Normal range of motion and neck supple.   Skin:     General: Skin is warm and dry.      Comments: 1.5 cm laceration on the right dorsal pointer finger, this overlies the DIP.  No active bleeding.  Laceration is superficial, nongaping, linear.   Neurological:      General: No focal deficit present.      Mental Status: He is alert and oriented to person, place, and time.         DDX/DIAGNOSTIC RESULTS / EMERGENCY DEPARTMENT  COURSE / MDM     Medical Decision Making  26 year old male presenting with laceration of the right pointer finger.  Tetanus up-to-date.  Exam notable for 1.5 cm linear laceration over the dorsum of the pointer finger, overlying the DIP joint.  No active bleeding.  No obvious joint involvement, appears very superficial.  Dermabond was used to close this with good approximation.  Patient discharged in stable condition with return precautions.        EKG      All EKG's are interpreted by the Emergency Department Physician who either signs or Co-signs this chart in the absence of a cardiologist.    EMERGENCY DEPARTMENT COURSE:    See above         PROCEDURES:  Dermabond wound closure        CONSULTS:  None      FINAL IMPRESSION      1. Laceration of right index finger without foreign body without damage to nail, initial encounter          DISPOSITION / PLAN     DISPOSITION Decision To Discharge 02/18/2021 07:42:20 PM      PATIENT REFERRED TO:  [facility] ED  [address]  [place] [address]  [phone]    If symptoms worsen    DISCHARGE MEDICATIONS:  There are no discharge medications for this patient.      Ramond Dial, DO  Emergency Medicine Resident    (Please note that portions of thisnote were completed with a voice recognition program.  Efforts were made to edit the dictations but occasionally words are mis-transcribed.)       Ramond Dial, DO  Resident  02/20/21 [phone]

## 2021-02-18 NOTE — Discharge Instructions (Addendum)
You were seen in the ER for a finger laceration. Please keep the area clean and dry. Follow up with your PCP.     Thank you for visiting [facility] Emergency Department.    You need to call No primary care provider on file. to make an appointment as directed for follow up.    Should you have any questions regarding your care or further treatment, please call [facility] at [phone].    Take any medications as prescribed, if given any, otherwise for pain Use ibuprofen or Tylenol (unless prescribed medications that have Tylenol in it).  You can take over the counter Ibuprofen (advil) tablets (4 tablets every 8 hours or 3 tablets every 6 hours or 2 tablets every 4 hours)    If given narcotics during this ED visit, please do not drive or operate heavy machinery for at least 4-6 hours.     PLEASE RETURN TO THE ED IMMEDIATELY for worsening symptoms, or if you develop any concerning symptoms such as: high fever not relieved by tylenol and/or motrin, chills, shortness of breath, chest pain, persistent nausea and/or vomiting, numbness, weakness or tingling in the arms or legs or change in color of the extremities, changes in mental status, persistent headache, blurry vision, inability to urinate, unable to follow up with your physician, or other any other  Care or concern.

## 2021-02-19 ENCOUNTER — Inpatient Hospital Stay
Admit: 2021-02-19 | Discharge: 2021-02-19 | Disposition: A | Discharge status: Home / Self Care | Attending: Emergency Medicine

## 2022-03-26 ENCOUNTER — Emergency Department: Admit: 2022-03-26 | Payer: PRIVATE HEALTH INSURANCE

## 2022-03-26 ENCOUNTER — Inpatient Hospital Stay
Admit: 2022-03-26 | Discharge: 2022-03-26 | Disposition: A | Discharge status: Home / Self Care | Payer: PRIVATE HEALTH INSURANCE | Attending: Emergency Medicine

## 2022-03-26 DIAGNOSIS — S61512A Laceration without foreign body of left wrist, initial encounter: Secondary | ICD-10-CM

## 2022-03-26 NOTE — Discharge Instructions (Signed)
You were seen here after sustaining an injury to your left wrist.  X-ray was unremarkable.  7 stitches were placed in the left wrist.  You need to return in 7 to 10 days to have the stitches removed.    You need to keep the laceration clean by washing it gently every day with soap and water.    You need to call and schedule follow-up appointment with your primary care provider for soon as possible.    Return to the emergency department immediately if you experience worsening symptoms, develop any new symptoms, or if you have any other concerns.

## 2022-03-26 NOTE — ED Provider Notes (Signed)
[place] ED     Emergency Department     Faculty Attestation        I performed a history and physical examination of the patient and discussed management with the resident. I reviewed the resident's note and agree with the documented findings and plan of care. Any areas of disagreement are noted on the chart. I was personally present for the key portions of any procedures. I have documented in the chart those procedures where I was not present during the key portions. I have reviewed the emergency nurses triage note. I agree with the chief complaint, past medical history, past surgical history, allergies, medications, social and family history as documented unless otherwise noted below.    For mid-level providers such as nurse practitioners as well as physicians assistants:    I have personally seen and evaluated the patient.    I find the patient's history and physical exam are consistent with NP/PA documentation.  I agree with the care provided, treatment rendered, disposition, & follow-up plan.     Additional findings are as noted.    Vital Signs: BP 112/75   Pulse 88   Temp 98 F (36.7 C) (Oral)   Resp 18   SpO2 95%   PCP:  No primary care provider on file.    Pertinent Comments:     Patient presents to the emergency department for evaluation of laceration to left wrist.  He is right-hand dominant he was carrying a heavy object at work when it fell on his left wrist.  He put a tourniquet on it with the help of his coworker.  He has a 3 cm gaping gaping laceration in the extensor surface of the left wrist but he has a strong radial and ulnar pulse.  He can flex and extend all digits in the left hand and he has normal range of motion.  There is no erythema warmth or crepitance.  He denies any homicidal or suicidal ideation and his story has been consistent with accidental trauma to the left wrist.  His tetanus is up-to-date where will irrigate, wound  closure, reassessment      Critical Care  None          Westly Pam, MD    Attending Emergency Medicine Physician            Festus Aloe, MD  03/26/22 1204

## 2022-03-26 NOTE — Procedures (Cosign Needed)
Laceration Repair Procedure Note    Performed by: Laqueta Linden, MD    Indication: Laceration    Consent: The patient provided verbal consent for this procedure.    Time out performed: Immediately prior to the procedure a "time out" was called to verify the correct patient, the correct procedure, equipment, support staff and site/side marked as required.      Procedure: The patient was placed in the appropriate position and anesthesia around the laceration was obtained by infiltration using 1% Lidocaine without epinephrine. The area was then cleansed with betadine and draped in a sterile fashion. The laceration was closed with 4-0 Prolene using interrupted sutures. There were no additional lacerations requiring repair.    The laceration was through the Subcutaneous tissue but did not enter the muscle layer.    Total repaired wound length: 1 inch.     Other Items: Suture count: 7    The patient tolerated the procedure well.    Complications: None

## 2022-03-26 NOTE — ED Notes (Signed)
Laceration to left wrist from a work injury, TD UTD within the last 5 years. Denies pain, states that metal cut arm and was not a self inflicted injury. Dressing to lac and covered with coban, slow oozing from site when pressure removed. Alert and oriented with no other injuries. Assessment complete

## 2022-03-26 NOTE — ED Provider Notes (Signed)
[facility] ED  Emergency Department Encounter  Emergency Medicine Resident     Pt Name:Max Day  MRN: [id number]  [place] [birth year]  Date of evaluation: 03/26/22  PCP:  No primary care provider on file.  Note Started: 11:42 AM EDT      CHIEF COMPLAINT       Chief Complaint   Patient presents with    Laceration     Left wrist       HISTORY OF PRESENT ILLNESS  (Location/Symptom, Timing/Onset, Context/Setting, Quality, Duration, Modifying Factors, Severity.)      Max Day is a 27 y.o. male who presents with a 1 inch laceration to the left anterior wrist.  Patient stated that he was at work carrying a door when the door fell on his left wrist causing a laceration.  Patient applied a self-made tourniquet and bandage over the wound and came to the emergency department due to persistent bleeding that was not stopping.  Patient states that he is up-to-date on his tetanus shot.  Patient denies this being a self-inflicted injury, denies suicidal ideation, states this is purely an accident that happened at work.    PAST MEDICAL / SURGICAL / SOCIAL / FAMILY HISTORY      has no past medical history on file.     has no past surgical history on file.    Social History     Socioeconomic History    Marital status: Single     Spouse name: Not on file    Number of children: Not on file    Years of education: Not on file    Highest education level: Not on file   Occupational History    Not on file   Tobacco Use    Smoking status: Never    Smokeless tobacco: Never   Vaping Use    Vaping Use: Never used   Substance and Sexual Activity    Alcohol use: Yes     Comment: 3x a week    Drug use: Never    Sexual activity: Not on file   Other Topics Concern    Not on file   Social History Narrative    Not on file     Social Determinants of Health     Financial Resource Strain: Not on file   Food Insecurity: Not on file   Transportation Needs: Not on file   Physical Activity: Not on file   Stress: Not on file   Social  Connections: Not on file   Intimate Partner Violence: Not on file   Housing Stability: Not on file       No family history on file.    Allergies:  Patient has no allergy information on record.    Home Medications:  Prior to Admission medications    Not on File     REVIEW OF SYSTEMS       Review of Systems   Constitutional:  Negative for fever.   Respiratory:  Negative for shortness of breath.    Cardiovascular:  Negative for chest pain.   Gastrointestinal:  Negative for abdominal pain.   Musculoskeletal:         Laceration to left wrist       PHYSICAL EXAM      INITIAL VITALS:   BP 112/75   Pulse 88   Temp 98 F (36.7 C) (Oral)   Resp 18   SpO2 95%     Physical Exam  Cardiovascular:  Rate and Rhythm: Normal rate and regular rhythm.      Pulses: Normal pulses.   Pulmonary:      Effort: Pulmonary effort is normal.      Breath sounds: Normal breath sounds.   Abdominal:      General: There is no distension.      Palpations: Abdomen is soft.      Tenderness: There is no abdominal tenderness. There is no guarding or rebound.   Musculoskeletal:      Comments: 1 inch laceration to the left anterior wrist.  Persistent oozing of blood present but no arterial bleeding.  Radial and ulnar pulses are intact.  Sensation and motor also intact however the fingers are cool to palpation.   Skin:     General: Skin is warm.      Capillary Refill: Capillary refill takes less than 2 seconds.   Neurological:      Mental Status: He is alert and oriented to person, place, and time.   Psychiatric:         Mood and Affect: Mood normal.       DDX/DIAGNOSTIC RESULTS / EMERGENCY DEPARTMENT COURSE / MDM     Medical Decision Making  26 year old male, presents to the emergency department due to 1 inch laceration to the left anterior wrist.  Patient stated that he was at work carrying a door when the door fell on his left wrist causing a laceration.  Patient applied a self-made tourniquet and bandage over the wound and came to the emergency  department due to persistent bleeding that was not stopping.  Patient states that he is up-to-date on his tetanus shot.  Patient denies this being a self-inflicted injury, denies suicidal ideation, states this is purely an accident that happened at work.    On evaluation, patient is well-appearing, nontoxic, afebrile.  Lung sounds are clear and equal bilaterally, abdomen soft nontender.  Evaluation of the left wrist reveals a 1 inch laceration to the left anterior wrist.  Persistent oozing of blood present but no arterial bleeding.  Sensation and motor also intact however the fingers are cool to palpation.  Patient did have a self-made tourniquet that was around the left forearm.  This tourniquet was removed.  Reassessment of the left once the tourniquet was removed revealed warm fingers. Radial and ulnar pulses are intact bilaterally.  Discussed with the patient that we are going to obtain an x-ray of the left wrist to rule out fracture and foreign body.  Will irrigate the laceration and will plan to close with 4-0 prolene.  Patient is agreeable with this plan.        Amount and/or Complexity of Data Reviewed  Radiology: ordered.      EMERGENCY DEPARTMENT COURSE:    ED Course as of 03/26/22 1350   Thu Mar 26, 2022   1329 X-ray negative.  Reassessed patient, updated him about his x-ray results.  Laceration was repaired, 7 stitches were placed.  Refer to procedure note.  Patient was advised to return in 7 to 10 days to have the stitches removed.  Patient was advised to follow-up with occupational health tomorrow to be cleared to return to work.  Patient was advised to call and schedule follow-up appointment with his primary care provider for soon as possible.  Patient is medically clear for discharge. [SD]      ED Course User Index  [SD] Jamise Pentland, Marzetta Board, MD       PROCEDURES:  None    CONSULTS:  None    FINAL IMPRESSION      1. Laceration of left wrist, initial encounter          DISPOSITION / PLAN     DISPOSITION  Decision To Discharge 03/26/2022 01:31:41 PM      PATIENT REFERRED TO:  [place] [address]  [phone]  Schedule an appointment as soon as possible for a visit       [facility] ED  [address]  [place] [address]  [phone]    As needed, If symptoms worsen      DISCHARGE MEDICATIONS:  New Prescriptions    No medications on file       Jinny Blossom, MD  Emergency Medicine Resident    (Please note that portions of this note were completed with a voice recognition program.  Efforts were made to edit the dictations but occasionally words are mis-transcribed.)
